# Patient Record
Sex: Female | Born: 1945 | Race: Black or African American | Hispanic: No | Marital: Married | Smoking: Never smoker
Health system: Southern US, Community
[De-identification: ages and names within clinical notes are randomized; demographics above are authoritative.]

## PROBLEM LIST (undated history)

## (undated) DIAGNOSIS — I10 Essential (primary) hypertension: Secondary | ICD-10-CM

## (undated) DIAGNOSIS — F0390 Unspecified dementia without behavioral disturbance: Secondary | ICD-10-CM

---

## 2008-06-18 ENCOUNTER — Inpatient Hospital Stay: Payer: Self-pay | Admitting: Internal Medicine

## 2010-08-31 ENCOUNTER — Emergency Department: Payer: Self-pay | Admitting: Internal Medicine

## 2010-09-09 ENCOUNTER — Ambulatory Visit: Payer: Self-pay | Admitting: Vascular Surgery

## 2015-08-16 ENCOUNTER — Encounter: Payer: Self-pay | Admitting: Emergency Medicine

## 2015-08-16 ENCOUNTER — Emergency Department
Admission: EM | Admit: 2015-08-16 | Discharge: 2015-08-16 | Disposition: A | Discharge status: Home / Self Care | Payer: Medicare Other | Attending: Emergency Medicine | Admitting: Emergency Medicine

## 2015-08-16 ENCOUNTER — Other Ambulatory Visit: Payer: Self-pay

## 2015-08-16 DIAGNOSIS — Y998 Other external cause status: Secondary | ICD-10-CM | POA: Insufficient documentation

## 2015-08-16 DIAGNOSIS — T476X1A Poisoning by antidiarrheal drugs, accidental (unintentional), initial encounter: Secondary | ICD-10-CM | POA: Diagnosis not present

## 2015-08-16 DIAGNOSIS — Y9389 Activity, other specified: Secondary | ICD-10-CM | POA: Insufficient documentation

## 2015-08-16 DIAGNOSIS — Y9289 Other specified places as the place of occurrence of the external cause: Secondary | ICD-10-CM | POA: Insufficient documentation

## 2015-08-16 DIAGNOSIS — T6591XA Toxic effect of unspecified substance, accidental (unintentional), initial encounter: Secondary | ICD-10-CM

## 2015-08-16 DIAGNOSIS — F0390 Unspecified dementia without behavioral disturbance: Secondary | ICD-10-CM | POA: Insufficient documentation

## 2015-08-16 DIAGNOSIS — I10 Essential (primary) hypertension: Secondary | ICD-10-CM | POA: Diagnosis not present

## 2015-08-16 DIAGNOSIS — T476X4A Poisoning by antidiarrheal drugs, undetermined, initial encounter: Secondary | ICD-10-CM | POA: Diagnosis present

## 2015-08-16 HISTORY — DX: Essential (primary) hypertension: I10

## 2015-08-16 LAB — CBC WITH DIFFERENTIAL/PLATELET
BASOS PCT: 1 %
Basophils Absolute: 0 10*3/uL (ref 0–0.1)
Eosinophils Absolute: 0.1 10*3/uL (ref 0–0.7)
Eosinophils Relative: 1 %
HEMATOCRIT: 30.7 % — AB (ref 35.0–47.0)
Hemoglobin: 10.3 g/dL — ABNORMAL LOW (ref 12.0–16.0)
LYMPHS ABS: 1.7 10*3/uL (ref 1.0–3.6)
LYMPHS PCT: 25 %
MCH: 28.4 pg (ref 26.0–34.0)
MCHC: 33.5 g/dL (ref 32.0–36.0)
MCV: 84.8 fL (ref 80.0–100.0)
MONO ABS: 0.6 10*3/uL (ref 0.2–0.9)
MONOS PCT: 9 %
NEUTROS ABS: 4.2 10*3/uL (ref 1.4–6.5)
Neutrophils Relative %: 64 %
Platelets: 180 10*3/uL (ref 150–440)
RBC: 3.62 MIL/uL — ABNORMAL LOW (ref 3.80–5.20)
RDW: 16.2 % — AB (ref 11.5–14.5)
WBC: 6.6 10*3/uL (ref 3.6–11.0)

## 2015-08-16 LAB — COMPREHENSIVE METABOLIC PANEL
ALT: 13 U/L — ABNORMAL LOW (ref 14–54)
ANION GAP: 10 (ref 5–15)
AST: 27 U/L (ref 15–41)
Albumin: 4 g/dL (ref 3.5–5.0)
Alkaline Phosphatase: 34 U/L — ABNORMAL LOW (ref 38–126)
BILIRUBIN TOTAL: 0.6 mg/dL (ref 0.3–1.2)
BUN: 17 mg/dL (ref 6–20)
CALCIUM: 9.2 mg/dL (ref 8.9–10.3)
CO2: 24 mmol/L (ref 22–32)
Chloride: 100 mmol/L — ABNORMAL LOW (ref 101–111)
Creatinine, Ser: 1.14 mg/dL — ABNORMAL HIGH (ref 0.44–1.00)
GFR calc Af Amer: 56 mL/min — ABNORMAL LOW (ref >60)
GFR, EST NON AFRICAN AMERICAN: 48 mL/min — AB (ref >60)
Glucose, Bld: 72 mg/dL (ref 65–99)
POTASSIUM: 3.8 mmol/L (ref 3.5–5.1)
Sodium: 134 mmol/L — ABNORMAL LOW (ref 135–145)
TOTAL PROTEIN: 7.3 g/dL (ref 6.5–8.1)

## 2015-08-16 LAB — ETHANOL

## 2015-08-16 LAB — ACETAMINOPHEN LEVEL

## 2015-08-16 LAB — SALICYLATE LEVEL: Salicylate Lvl: <4 mg/dL (ref 2.8–30.0)

## 2015-08-16 NOTE — ED Provider Notes (Signed)
CSN: [id number]     Arrival date & time 08/16/15  1441 History   First MD Initiated Contact with Patient 08/16/15 1503     Chief Complaint  Patient presents with  . Ingestion     (Consider location/radiation/quality/duration/timing/severity/associated sxs/prior Treatment) The history is provided by the patient.  Gabriella Harrell is a 69 y.o. female hx of HTN, dementia here with possible ingestion. As per husband, patient may have taken some of his lomotil. He filled his prescription 8/31 and had 30 pills. He takes up to 5 pills a day. He noticed that there are only 2 pills left in the bottle. He wasn't sure how much was in it earlier today but thought that she may have taken it. Patient denies taking any additional medicines but she does have a history of dementia. She is on venlafaxine , lisinopril , letrozole 2/5mg  but denies taking more meds. Denies vomiting, fever, ab pain, chest pain. Denies suicidal ideation or homicidal ideation.  Level V caveat- dementia    Past Medical History  Diagnosis Date  . Hypertension    No past surgical history on file. No family history on file. Social History  Substance Use Topics  . Smoking status: Never Smoker   . Smokeless tobacco: None  . Alcohol Use: No   OB History    No data available     Review of Systems  Psychiatric/Behavioral: Negative for suicidal ideas and self-injury.  All other systems reviewed and are negative.     Allergies  Review of patient's allergies indicates no known allergies.  Home Medications   Prior to Admission medications   Not on File   BP 132/64 mmHg  Pulse 74  Temp(Src) 97.6 F (36.4 C) (Oral)  Resp 20  Ht  (1.753 m)  Wt 159 lb (72.122 kg)  BMI 23.47 kg/m2  SpO2 97% Physical Exam  Constitutional: She is oriented to person, place, and time.  Demented, NAD, pleasant   HENT:  Head: Normocephalic.  Mouth/Throat: Oropharynx is clear and moist.  Eyes: Conjunctivae are normal.  Pupils are equal, round, and reactive to light.  Neck: Normal range of motion. Neck supple.  Cardiovascular: Normal rate, regular rhythm and normal heart sounds.   Pulmonary/Chest: Effort normal and breath sounds normal. No respiratory distress. She has no wheezes. She has no rales.  Abdominal: Soft. Bowel sounds are normal. She exhibits no distension. There is no tenderness. There is no rebound and no guarding.  Musculoskeletal: Normal range of motion. She exhibits no edema or tenderness.  Neurological: She is alert and oriented to person, place, and time.  Demented, nl strength and sensation throughout   Skin: Skin is warm and dry.  Psychiatric: She has a normal mood and affect. Her behavior is normal. Judgment and thought content normal.  Nursing note and vitals reviewed.   ED Course  Procedures (including critical care time) Labs Review Labs Reviewed  CBC WITH DIFFERENTIAL/PLATELET - Abnormal; Notable for the following:    RBC 3.62 (*)    Hemoglobin 10.3 (*)    HCT 30.7 (*)    RDW 16.2 (*)    All other components within normal limits  COMPREHENSIVE METABOLIC PANEL - Abnormal; Notable for the following:    Sodium 134 (*)    Chloride 100 (*)    Creatinine, Ser 1.14 (*)    ALT 13 (*)    Alkaline Phosphatase 34 (*)    GFR calc non Af Amer 48 (*)    GFR calc  Af Amer 56 (*)    All other components within normal limits  ACETAMINOPHEN LEVEL - Abnormal; Notable for the following:    Acetaminophen (Tylenol), Serum <10 (*)    All other components within normal limits  ETHANOL  SALICYLATE LEVEL  URINE DRUG SCREEN, QUALITATIVE ([facility] ONLY)    Imaging Review No results found. I have personally reviewed and evaluated these images and lab results as part of my medical decision-making.   EKG Interpretation None      ED ECG REPORT I, YAO, DAVID, the attending physician, personally viewed and interpreted this ECG.   Date: 08/16/2015  EKG Time: 15:38  Rate: 71  Rhythm: normal  EKG, normal sinus rhythm, unchanged from previous tracings  Axis: normal  Intervals:none  ST&T Change: none   MDM   Final diagnoses:  None    Gabriella Harrell is a 69 y.o. female here with possible lomotil ingestion. There are 30 pills filled a month ago and husband takes 1-2 daily so likely not much left at this point so she at most took 5-10 pills. I consulted poison control, who recommend screening labs. No signs of cholinergic symptoms. She is on other meds but I doubt significant ingestion. Will observe.   5:35 PM Labs at baseline. Tox neg. BP improved to 132/64 on recheck with no intervention. Discussed with poison control, who recommend discharge    Richardean Canal, MD 08/16/15 1746

## 2015-08-16 NOTE — Discharge Instructions (Signed)
Take your meds as prescribed and not other people's medicines.   Follow up with your doctor.   Return to ER if you are constipated, fever, vomiting, not behaving normally.

## 2015-08-16 NOTE — ED Notes (Signed)
Pt arrives to ed via [place] [facility] with reports of possibly taking an unknown amount of lamotil. Pt's husband currently being treated for cancer and has to take it for diarrhea. The medicine was filled on 8/31 with #30 count, husband supposed to be taking up to 5 pills a day. Ems reports only two pills left in bottle. Not sure how many pills husband was taking a day due to pt having dementia. Ems reports vss for patient. Other information reported to ems by a "cousin".  Pt currently taking venlafaxine , lisinopril , letrozole 2/5mg .  Pt denies any thoughts of SI or HI

## 2015-09-07 ENCOUNTER — Emergency Department
Admission: EM | Admit: 2015-09-07 | Discharge: 2015-09-09 | Disposition: A | Discharge status: Other Acute Inpatient Hospital | Payer: Medicare Other | Attending: Emergency Medicine | Admitting: Emergency Medicine

## 2015-09-07 ENCOUNTER — Encounter: Payer: Self-pay | Admitting: *Deleted

## 2015-09-07 DIAGNOSIS — I10 Essential (primary) hypertension: Secondary | ICD-10-CM | POA: Insufficient documentation

## 2015-09-07 DIAGNOSIS — F0391 Unspecified dementia with behavioral disturbance: Secondary | ICD-10-CM | POA: Diagnosis not present

## 2015-09-07 DIAGNOSIS — R451 Restlessness and agitation: Secondary | ICD-10-CM

## 2015-09-07 DIAGNOSIS — R7989 Other specified abnormal findings of blood chemistry: Secondary | ICD-10-CM

## 2015-09-07 DIAGNOSIS — R419 Unspecified symptoms and signs involving cognitive functions and awareness: Secondary | ICD-10-CM

## 2015-09-07 HISTORY — DX: Unspecified dementia, unspecified severity, without behavioral disturbance, psychotic disturbance, mood disturbance, and anxiety: F03.90

## 2015-09-07 LAB — CBC
HEMATOCRIT: 31.7 % — AB (ref 35.0–47.0)
HEMOGLOBIN: 10.7 g/dL — AB (ref 12.0–16.0)
MCH: 28.2 pg (ref 26.0–34.0)
MCHC: 33.8 g/dL (ref 32.0–36.0)
MCV: 83.6 fL (ref 80.0–100.0)
Platelets: 182 10*3/uL (ref 150–440)
RBC: 3.79 MIL/uL — AB (ref 3.80–5.20)
RDW: 16 % — ABNORMAL HIGH (ref 11.5–14.5)
WBC: 4.6 10*3/uL (ref 3.6–11.0)

## 2015-09-07 LAB — COMPREHENSIVE METABOLIC PANEL
ALBUMIN: 4 g/dL (ref 3.5–5.0)
ALK PHOS: 33 U/L — AB (ref 38–126)
ALT: 14 U/L (ref 14–54)
ANION GAP: 6 (ref 5–15)
AST: 21 U/L (ref 15–41)
BILIRUBIN TOTAL: 0.4 mg/dL (ref 0.3–1.2)
BUN: 13 mg/dL (ref 6–20)
CALCIUM: 8.8 mg/dL — AB (ref 8.9–10.3)
CO2: 28 mmol/L (ref 22–32)
Chloride: 103 mmol/L (ref 101–111)
Creatinine, Ser: 0.92 mg/dL (ref 0.44–1.00)
GFR calc non Af Amer: >60 mL/min (ref >60)
GLUCOSE: 119 mg/dL — AB (ref 65–99)
POTASSIUM: 3.6 mmol/L (ref 3.5–5.1)
SODIUM: 137 mmol/L (ref 135–145)
TOTAL PROTEIN: 7 g/dL (ref 6.5–8.1)

## 2015-09-07 LAB — URINALYSIS COMPLETE WITH MICROSCOPIC (ARMC ONLY)
BILIRUBIN URINE: NEGATIVE
Bacteria, UA: NONE SEEN
Glucose, UA: NEGATIVE mg/dL
KETONES UR: NEGATIVE mg/dL
NITRITE: NEGATIVE
PH: 7 (ref 5.0–8.0)
PROTEIN: NEGATIVE mg/dL
SPECIFIC GRAVITY, URINE: 1.006 (ref 1.005–1.030)

## 2015-09-07 LAB — ETHANOL

## 2015-09-07 LAB — TSH: TSH: 0.336 u[IU]/mL — ABNORMAL LOW (ref 0.350–4.500)

## 2015-09-07 NOTE — ED Notes (Addendum)
Patient resting quietly in room.  No noted distress or abnormal behaviors noted.  Will continue 15 minute checks and observation by security camera for safety.    ED BHU PLACEMENT JUSTIFICATION Is the patient under IVC or is there intent for IVC: No. Is the patient medically cleared: Yes.   Is there vacancy in the ED BHU: Yes.   Is the population mix appropriate for patient: No. Is the patient awaiting placement in inpatient or outpatient setting: No. Has the patient had a psychiatric consult: No. Survey of unit performed for contraband, proper placement and condition of furniture, tampering with fixtures in bathroom, shower, and each patient room: Yes.  ; Findings:  APPEARANCE/BEHAVIOR Pt asleep NEURO ASSESSMENT Orientation: Pt asleep Hallucinations: Pt asleep Speech: Pt asleep Gait:Pt asleep RESPIRATORY ASSESSMENT Normal expansion.  Clear to auscultation.  No rales, rhonchi, or wheezing. CARDIOVASCULAR ASSESSMENT regular rate and rhythm, S1, S2 normal, no murmur, click, rub or gallop GASTROINTESTINAL ASSESSMENT soft, nontender, BS WNL, no r/g EXTREMITIES Pt asleep PLAN OF CARE Provide calm/safe environment. Vital signs assessed twice daily. ED BHU Assessment once each 12-hour shift. Collaborate with intake RN daily or as condition indicates. Assure the ED provider has rounded once each shift. Provide and encourage hygiene. Provide redirection as needed. Assess for escalating behavior; address immediately and inform ED provider.  Assess family dynamic and appropriateness for visitation as needed: No.; If necessary, describe findings: Pt asleep Educate the patient/family about BHU procedures/visitation: No.; If necessary, describe findings: Pt asleep  ENVIRONMENTAL ASSESSMENT Potentially harmful objects out of patient reach: Yes.   Personal belongings secured: Yes.   Patient dressed in hospital provided attire only: Yes.   Plastic bags out of patient reach: Yes.   Patient care  equipment (cords, cables, call bells, lines, and drains) shortened, removed, or accounted for: Yes.   Equipment and supplies removed from bottom of stretcher: Yes.   Potentially toxic materials out of patient reach: Yes.   Sharps container removed or out of patient reach: Yes.

## 2015-09-07 NOTE — ED Notes (Signed)
BEHAVIORAL HEALTH ROUNDING Patient sleeping: Yes.   Patient alert and oriented: not applicable Behavior appropriate: Yes.   Nutrition and fluids offered: Yes  Toileting and hygiene offered: Yes  Sitter present: yes Law enforcement present: Yes  
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
## 2015-09-07 NOTE — ED Notes (Signed)
BEHAVIORAL HEALTH ROUNDING Patient sleeping: No. Patient alert and oriented: no Behavior appropriate: Yes.  ; If no, describe: dementia Nutrition and fluids offered: Yes  Toileting and hygiene offered: Yes  Sitter present: yes Law enforcement present: Yes

## 2015-09-07 NOTE — ED Notes (Addendum)
Gabriella GrimmerDianh Harrell [phone]  [phone] sister-in-law Gabriella CornfieldWilliam Harrell- brother [phone]  Clothes and belongings sent home with family

## 2015-09-07 NOTE — ED Notes (Signed)
ENVIRONMENTAL ASSESSMENT Potentially harmful objects out of patient reach: Yes.   Personal belongings secured: No. Patient dressed in hospital provided attire only: Yes.   Plastic bags out of patient reach: Yes.   Patient care equipment (cords, cables, call bells, lines, and drains) shortened, removed, or accounted for: Yes.   Equipment and supplies removed from bottom of stretcher: Yes.   Potentially toxic materials out of patient reach: Yes.   Sharps container removed or out of patient reach: Yes.   

## 2015-09-07 NOTE — ED Notes (Signed)
Cell number for brother, Chrissie NoaWilliam: [phone]
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
## 2015-09-07 NOTE — BH Assessment (Addendum)
Assessment Note  During the assessment the patient only starred at the wall and did not answer a lot of the questions.  Patient is a poor historian.    Writer was only able to obtain limited collateral information from the patient's brother Judeth Cornfield [phone]).  Per Chrissie Noa, the patient has, "has not been herself for several years".  Per Chrissie Noa, the patient has never been suicidal, homicidal, psychotic or problems with substance abuse.  Chrissie Noa reports that he is not sure if the patient has been diagnosed with Dementia.  He did report that the patient does not take any type of medication that he is aware of.   Per Chrissie Noa, the patient has been forgetful for several years and requires constant supervision.  The patient resides at home with her husband.  The patient has been married for over 30 years.  Per Chrissie Noa he only has limited information about his sister.  Her brother reports that her husband will have more information regarding his sister.     Husband is Lolly Glaus [phone]) attempted to obtain collateral information without success.  Writer was not able to leave a voice mail message because the voice mail system was not activated.    Diagnosis: Mood Disorder   Past Medical History:  Past Medical History  Diagnosis Date  . Hypertension   . Dementia     History reviewed. No pertinent past surgical history.  Family History: History reviewed. No pertinent family history.  Social History:  reports that she has never smoked. She does not have any smokeless tobacco history on file. She reports that she does not drink alcohol. Her drug history is not on file.  Additional Social History:  Alcohol / Drug Use History of alcohol / drug use?: No history of alcohol / drug abuse  CIWA: CIWA-Ar BP: (!) 151/82 mmHg Pulse Rate: 85 COWS:    Allergies: No Known Allergies  Home Medications:  (Not in a hospital admission)  OB/GYN Status:  No LMP recorded. Patient  has had a hysterectomy.  General Assessment Data Location of Assessment: [facility] ED TTS Assessment: In system Is this a Tele or Face-to-Face Assessment?: Face-to-Face Is this an Initial Assessment or a Re-assessment for this encounter?: Initial Assessment Marital status:  (Unable to assess) Maiden name: NA Is patient pregnant?: No Pregnancy Status: No Living Arrangements: Other relatives Can pt return to current living arrangement?: Yes Admission Status: Voluntary Is patient capable of signing voluntary admission?: Yes Referral Source: Self/Family/Friend Insurance type: Medicare  Medical Screening Exam [facility] Walk-in ONLY) Medical Exam completed: Yes  Crisis Care Plan Living Arrangements: Other relatives Name of Psychiatrist: Unable to assess Name of Therapist: Unable to assess  Education Status Is patient currently in school?: No Current Grade: NA Highest grade of school patient has completed: NA Name of school: NA Contact person: NA  Risk to self with the past 6 months Suicidal Ideation: No Has patient been a risk to self within the past 6 months prior to admission? : No Suicidal Intent: No Has patient had any suicidal intent within the past 6 months prior to admission? : No Is patient at risk for suicide?: No Suicidal Plan?: No Has patient had any suicidal plan within the past 6 months prior to admission? : No Access to Means: No What has been your use of drugs/alcohol within the last 12 months?: NA Previous Attempts/Gestures: No How many times?: 0 Other Self Harm Risks: NA Triggers for Past Attempts: None known Intentional Self Injurious Behavior: None Family  Suicide History: No Recent stressful life event(s):  (NA) Persecutory voices/beliefs?: No Depression: No Depression Symptoms: Despondent Substance abuse history and/or treatment for substance abuse?: No Suicide prevention information given to non-admitted patients: Not applicable  Risk to Others within the  past 6 months Homicidal Ideation: No Does patient have any lifetime risk of violence toward others beyond the six months prior to admission? : No Thoughts of Harm to Others: No Current Homicidal Intent: No Current Homicidal Plan: No Access to Homicidal Means: No Identified Victim: NA History of harm to others?: No Assessment of Violence: None Noted Violent Behavior Description: NA Does patient have access to weapons?: No Criminal Charges Pending?: No Does patient have a court date: No Is patient on probation?: No  Psychosis Hallucinations: None noted Delusions: None noted  Mental Status Report Appearance/Hygiene: Disheveled Eye Contact: Poor Motor Activity: Unable to assess Speech: Unable to assess Level of Consciousness: Unable to assess Mood: Depressed Affect: Unable to Assess Anxiety Level: None Thought Processes: Unable to Assess Judgement: Unable to Assess Orientation: Not oriented Obsessive Compulsive Thoughts/Behaviors: Unable to Assess  Cognitive Functioning Concentration: Unable to Assess Memory: Unable to Assess IQ: Average Insight: Unable to Assess Impulse Control: Unable to Assess Appetite:  (UTA) Weight Loss:  (UTA) Weight Gain:  (UTA) Sleep: Unable to Assess Total Hours of Sleep:  (UTA) Vegetative Symptoms: Unable to Assess  ADLScreening [facility] Assessment Services) Patient's cognitive ability adequate to safely complete daily activities?: No Patient able to express need for assistance with ADLs?: No Independently performs ADLs?: No  Prior Inpatient Therapy Prior Inpatient Therapy: No Prior Therapy Dates: UTA Prior Therapy Facilty/Provider(s): UTA Reason for Treatment: UTA  Prior Outpatient Therapy Prior Outpatient Therapy: No Prior Therapy Dates: UTA Prior Therapy Facilty/Provider(s): UTA Reason for Treatment: UTA Does patient have an ACCT team?: No Does patient have Intensive In-House Services?  : No Does patient have [facility] services? :  No Does patient have P4CC services?: No  ADL Screening (condition at time of admission) Patient's cognitive ability adequate to safely complete daily activities?: No Is the patient deaf or have difficulty hearing?: No Does the patient have difficulty seeing, even when wearing glasses/contacts?: No Does the patient have difficulty concentrating, remembering, or making decisions?: Yes Patient able to express need for assistance with ADLs?: No Does the patient have difficulty dressing or bathing?: Yes Independently performs ADLs?: No Does the patient have difficulty walking or climbing stairs?: Yes Weakness of Legs:  (Unable to assess) Weakness of Arms/Hands:  (Unable to assess)  Home Assistive Devices/Equipment Home Assistive Devices/Equipment:  (Unable to assess)    Abuse/Neglect Assessment (Assessment to be complete while patient is alone) Physical Abuse:  (Unable to assess) Verbal Abuse:  (Unable to assess) Sexual Abuse:  (Unable to assess) Exploitation of patient/patient's resources:  (Unable to assess) Self-Neglect:  (Unable to assess) Values / Beliefs Cultural Requests During Hospitalization: None Spiritual Requests During Hospitalization: None Consults Spiritual Care Consult Needed: No Social Work Consult Needed: No Merchant navy officer (For Healthcare) Does patient have an advance directive?: No Would patient like information on creating an advanced directive?: No - patient declined information    Additional Information 1:1 In Past 12 Months?: No CIRT Risk: No Elopement Risk: No Does patient have medical clearance?: Yes     Disposition: Pending psych disposition.  Disposition Initial Assessment Completed for this Encounter: Yes Disposition of Patient: Other dispositions (Pending psych disposition.) Other disposition(s):  (Pending psych disposition .)  On Site Evaluation by:   Reviewed with Physician:  Phillip HealStevenson, Fayrene Towner [place] 09/07/2015 9:10 PM

## 2015-09-08 ENCOUNTER — Emergency Department: Payer: Medicare Other

## 2015-09-08 DIAGNOSIS — R7989 Other specified abnormal findings of blood chemistry: Secondary | ICD-10-CM

## 2015-09-08 DIAGNOSIS — R419 Unspecified symptoms and signs involving cognitive functions and awareness: Secondary | ICD-10-CM

## 2015-09-08 DIAGNOSIS — F99 Mental disorder, not otherwise specified: Secondary | ICD-10-CM

## 2015-09-08 LAB — T4, FREE: FREE T4: 0.81 ng/dL (ref 0.61–1.12)

## 2015-09-08 NOTE — ED Notes (Signed)
Patient resting quietly in room. No noted distress or abnormal behaviors noted. Will continue 15 minute checks. 

## 2015-09-08 NOTE — ED Notes (Signed)
BEHAVIORAL HEALTH ROUNDING Patient sleeping: No. Patient alert and oriented: yes Behavior appropriate: Yes.  ; If no, describe:  Nutrition and fluids offered: Yes  Toileting and hygiene offered: Yes  Sitter present: no Law enforcement present: Yes  

## 2015-09-08 NOTE — ED Notes (Signed)
BEHAVIORAL HEALTH ROUNDING Patient sleeping: Yes.   Patient alert and oriented: not applicable Behavior appropriate: Yes.  ; If no, describe:  Nutrition and fluids offered: No Toileting and hygiene offered: No Sitter present: q15min rounding Law enforcement present: Yes   

## 2015-09-08 NOTE — ED Notes (Signed)
BEHAVIORAL HEALTH ROUNDING Patient sleeping: Yes.   Patient alert and oriented: not applicable Behavior appropriate: Yes.  ; If no, describe:  Nutrition and fluids offered: Yes  Toileting and hygiene offered: Yes  Sitter present: yes Law enforcement present: Yes  

## 2015-09-08 NOTE — ED Notes (Signed)
Pt is ambulatory in room   Wrapped in blanket. Denies any complaints  Skin w/d and resp even and non labored
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
## 2015-09-08 NOTE — ED Notes (Signed)
BEHAVIORAL HEALTH ROUNDING Patient sleeping: Yes.   Patient alert and oriented: not applicable Behavior appropriate: Yes.  ; If no, describe:  Nutrition and fluids offered: No Toileting and hygiene offered: No Sitter present: yes Law enforcement present: Yes   

## 2015-09-08 NOTE — ED Notes (Addendum)
BEHAVIORAL HEALTH ROUNDING Patient sleeping: No. Patient alert and oriented: yes Behavior appropriate: Yes.  ; If no, describe:  Nutrition and fluids offered: Yes  Toileting and hygiene offered: Yes  Sitter present: yes Law enforcement present: Yes  

## 2015-09-08 NOTE — ED Notes (Signed)
Patient resting quietly in room.  No noted distress or abnormal behaviors noted.  Will continue 15 minute checks and observation.  

## 2015-09-08 NOTE — ED Notes (Signed)
Patient still has a ring in place unable to remove ring  At this time

## 2015-09-08 NOTE — ED Notes (Signed)
BEHAVIORAL HEALTH ROUNDING Patient sleeping: No. Patient alert and oriented: a&ox1 Behavior appropriate: Yes.  ; If no, describe: cooperative, but paces incessently Nutrition and fluids offered: Yes  Toileting and hygiene offered: Yes  Sitter present: yes Law enforcement present: Yes

## 2015-09-08 NOTE — ED Notes (Signed)
BEHAVIORAL HEALTH ROUNDING Patient sleeping: Yes.   Patient alert and oriented: no Behavior appropriate: Yes.  ; If no, describe:  Nutrition and fluids offered: sleeping Toileting and hygiene offered: sleeping Sitter present: no Law enforcement present: Yes
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
## 2015-09-08 NOTE — ED Notes (Signed)
BEHAVIORAL HEALTH ROUNDING Patient sleeping: No. Patient alert and oriented: yes Behavior appropriate: Yes.  ; If no, describe:  Nutrition and fluids offered: Yes  Toileting and hygiene offered: Yes  Sitter present: yes Law enforcement present: Yes  

## 2015-09-08 NOTE — ED Notes (Signed)
Gabriella Harrell is Voluntary is waiting for placement

## 2015-09-08 NOTE — ED Notes (Signed)

## 2015-09-08 NOTE — ED Provider Notes (Signed)
-----------------------------------------   6:47 AM on 09/08/2015 -----------------------------------------   Blood pressure 151/82, pulse 85, temperature 98 F (36.7 C), temperature source Oral, resp. rate 16, height 5\' 9"  (1.753 m), weight 160 lb (72.576 kg), SpO2 100 %.  The patient had no acute events since last update.  Calm and cooperative at this time.  Disposition is pending per Psychiatry/Behavioral Medicine team recommendations.     Irean HongJade J Vaden Becherer, MD 09/08/15 [phone]

## 2015-09-09 LAB — FOLATE: Folate: 9.9 ng/mL (ref >5.9)

## 2015-09-09 MED ORDER — TRAZODONE HCL 50 MG PO TABS: 25.0000 mg | ORAL_TABLET | Freq: Every day | ORAL | Status: DC

## 2015-09-09 MED ORDER — MEMANTINE HCL 5 MG PO TABS
5.0000 mg | ORAL_TABLET | Freq: Every day | ORAL | Status: DC
  Administered 2015-09-09: 5 mg via ORAL
  Filled 2015-09-09: qty 1

## 2015-09-09 NOTE — ED Notes (Signed)
BEHAVIORAL HEALTH ROUNDING Patient sleeping: Yes.   Patient alert and oriented: no Behavior appropriate: Yes.  ; If no, describe:  Nutrition and fluids offered: sleeping Toileting and hygiene offered: No Sitter present: no Law enforcement present: Yes

## 2015-09-09 NOTE — ED Notes (Signed)

## 2015-09-09 NOTE — ED Notes (Signed)
BEHAVIORAL HEALTH ROUNDING Patient sleeping: No. Patient alert and oriented: yes Behavior appropriate: Yes.  ; If no, describe:  Nutrition and fluids offered: yes Toileting and hygiene offered: Yes  Sitter present: no Law enforcement present: Yes  

## 2015-09-09 NOTE — ED Notes (Signed)
BEHAVIORAL HEALTH ROUNDING Patient sleeping: Yes.   Patient alert and oriented: sleeping Behavior appropriate: Yes.  ; If no, describe:  Nutrition and fluids offered: sleeping Toileting and hygiene offered: sleeping Sitter present: no Law enforcement present: Yes  

## 2015-09-09 NOTE — ED Notes (Signed)
BEHAVIORAL HEALTH ROUNDING Patient sleeping: No. Patient alert and oriented: yes Behavior appropriate: Yes.  ; If no, describe:  Nutrition and fluids offered: Yes  Toileting and hygiene offered: Yes  Sitter present: no Law enforcement present: Yes  

## 2015-09-09 NOTE — ED Provider Notes (Signed)
-----------------------------------------   3:25 PM on 09/09/2015 -----------------------------------------   Blood pressure 106/70, pulse 80, temperature 98.4 F (36.9 C), temperature source Oral, resp. rate 18, height 5\' 9"  (1.753 m), weight 160 lb (72.576 kg), SpO2 99 %.  The patient had no acute events since last update.  Calm and cooperative at this time.  Patient has been accepted to [facility].  Vital stable this time. The patient is calm and cooperative. I did review her labs which had 1+ leukocyte esterase in the urine. The patient is not having any symptoms at this time. She denies any burning with urination, frequency or abdominal pain.      Myrna Blazeravid Matthew Karmel Patricelli, MD 09/09/15 [phone]

## 2015-09-09 NOTE — ED Notes (Signed)
BEHAVIORAL HEALTH ROUNDING Patient sleeping: Yes.   Patient alert and oriented: oriented to self Behavior appropriate: Yes.  ; If no, describe:  Nutrition and fluids offered: sleeping Toileting and hygiene offered: sleeping Sitter present: no Law enforcement present: Yes

## 2015-09-09 NOTE — [facility] Counselor (Signed)
[name] of [name] called stating they have a bed for referred patient Gabriella Harrell, Intake phone # [phone]. Dr. Satira AnisL. Akers is the accepting MD. Report should be called to [phone].

## 2015-09-09 NOTE — ED Notes (Signed)
Pt sleeping. 

## 2015-09-09 NOTE — Consult Note (Signed)
[facility] Consult   Reason for Consult:  Agitation   Patient Identification: Gabriella Harrell MRN:  [id number] Principal Diagnosis: Dementia Diagnosis:   Patient Active Problem List   Diagnosis Date Noted  . Neurocognitive disorder, unspecified [F99] 09/08/2015  . Abnormal TSH [R79.89] 09/08/2015    Total Time spent with patient: 30 minutes  Subjective:   Gabriella Harrell is a 69 y.o. female patient admitted with increased confusion and agitation. Per ED physician notes, the pt got out of the house 3 times today and has been difficult to restrain. She was fighting with family so she was brought to the ED by the [facility] department.   During my interview patient was calm and quiet and responded politely.   Nursing notes were reviewed. No acute events. Reasons for admission were reviewed.   She was able to tell me that she is currently in the hospital and had to be redirected.   The pt is oriented to self. She believes she is currently in [place] and shared that today is a Sunday in October.  The pt denies feeling hopeless, helpless and worthless. She denies there being weapons at home. She notes her husband is trying to put her puppies "down" but the pt shared she wants to save her puppies and put her husband down.   Patient is currently not taking any medications for depression or anxiety.     Risk to Self: Suicidal Ideation: No Suicidal Intent: No Is patient at risk for suicide?: No Suicidal Plan?: No Access to Means: No What has been your use of drugs/alcohol within the last 12 months?: NA How many times?: 0 Other Self Harm Risks: NA Triggers for Past Attempts: None known Intentional Self Injurious Behavior: None Risk to Others: Homicidal Ideation: No Thoughts of Harm to Others: No Current Homicidal Intent: No Current Homicidal Plan: No Access to Homicidal Means: No Identified Victim: NA History of harm to others?: No Assessment of  Violence: None Noted Violent Behavior Description: NA Does patient have access to weapons?: No Criminal Charges Pending?: No Does patient have a court date: No Prior Inpatient Therapy: Prior Inpatient Therapy: No Prior Therapy Dates: UTA Prior Therapy Facilty/Provider(s): UTA Reason for Treatment: UTA Prior Outpatient Therapy: Prior Outpatient Therapy: No Prior Therapy Dates: UTA Prior Therapy Facilty/Provider(s): UTA Reason for Treatment: UTA Does patient have an ACCT team?: No Does patient have Intensive In-House Services?  : No Does patient have [facility] services? : No Does patient have P4CC services?: No  Past Medical History:  Past Medical History  Diagnosis Date  . Hypertension   . Dementia    History reviewed. No pertinent past surgical history. Family History: History reviewed. No pertinent family history.  Social History:  History  Alcohol Use No     History  Drug Use Not on file    Social History   Social History  . Marital Status: Married    Spouse Name: N/A  . Number of Children: N/A  . Years of Education: N/A   Social History Main Topics  . Smoking status: Never Smoker   . Smokeless tobacco: None  . Alcohol Use: No  . Drug Use: None  . Sexual Activity: Not Asked   Other Topics Concern  . None   Social History Narrative   Additional Social History:    History of alcohol / drug use?: No history of alcohol / drug abuse  Allergies:  No Known Allergies  Labs:  Results for orders placed or performed during the hospital encounter of 09/07/15 (from the past 48 hour(s))  Comprehensive metabolic panel     Status: Abnormal   Collection Time: 09/07/15  4:14 PM  Result Value Ref Range   Sodium 137 135 - 145 mmol/L   Potassium 3.6 3.5 - 5.1 mmol/L   Chloride 103 101 - 111 mmol/L   CO2 28 22 - 32 mmol/L   Glucose, Bld 119 (H) 65 - 99 mg/dL   BUN 13 6 - 20 mg/dL   Creatinine, Ser 0.92 0.44 - 1.00 mg/dL   Calcium 8.8 (L)  8.9 - 10.3 mg/dL   Total Protein 7.0 6.5 - 8.1 g/dL   Albumin 4.0 3.5 - 5.0 g/dL   AST 21 15 - 41 U/L   ALT 14 14 - 54 U/L   Alkaline Phosphatase 33 (L) 38 - 126 U/L   Total Bilirubin 0.4 0.3 - 1.2 mg/dL   GFR calc non Af Amer >60 >60 mL/min   GFR calc Af Amer >60 >60 mL/min    Comment: (NOTE) The eGFR has been calculated using the CKD EPI equation. This calculation has not been validated in all clinical situations. eGFR's persistently <60 mL/min signify possible Chronic Kidney Disease.    Anion gap 6 5 - 15  CBC     Status: Abnormal   Collection Time: 09/07/15  4:14 PM  Result Value Ref Range   WBC 4.6 3.6 - 11.0 K/uL   RBC 3.79 (L) 3.80 - 5.20 MIL/uL   Hemoglobin 10.7 (L) 12.0 - 16.0 g/dL   HCT 31.7 (L) 35.0 - 47.0 %   MCV 83.6 80.0 - 100.0 fL   MCH 28.2 26.0 - 34.0 pg   MCHC 33.8 32.0 - 36.0 g/dL   RDW 16.0 (H) 11.5 - 14.5 %   Platelets 182 150 - 440 K/uL  Ethanol     Status: None   Collection Time: 09/07/15  4:14 PM  Result Value Ref Range   Alcohol, Ethyl (B) <5 <5 mg/dL    Comment:        LOWEST DETECTABLE LIMIT FOR SERUM ALCOHOL IS 5 mg/dL FOR MEDICAL PURPOSES ONLY   TSH     Status: Abnormal   Collection Time: 09/07/15  4:14 PM  Result Value Ref Range   TSH 0.336 (L) 0.350 - 4.500 uIU/mL  T4, free     Status: None   Collection Time: 09/07/15  4:14 PM  Result Value Ref Range   Free T4 0.81 0.61 - 1.12 ng/dL  Urinalysis complete, with microscopic ([facility] only)     Status: Abnormal   Collection Time: 09/07/15  4:37 PM  Result Value Ref Range   Color, Urine YELLOW (A) YELLOW   APPearance CLEAR (A) CLEAR   Glucose, UA NEGATIVE NEGATIVE mg/dL   Bilirubin Urine NEGATIVE NEGATIVE   Ketones, ur NEGATIVE NEGATIVE mg/dL   Specific Gravity, Urine 1.006 1.005 - 1.030   Hgb urine dipstick 1+ (A) NEGATIVE   pH 7.0 5.0 - 8.0   Protein, ur NEGATIVE NEGATIVE mg/dL   Nitrite NEGATIVE NEGATIVE   Leukocytes, UA 1+ (A) NEGATIVE   RBC / HPF 0-5 0 - 5 RBC/hpf   WBC, UA 0-5 0  - 5 WBC/hpf   Bacteria, UA NONE SEEN NONE SEEN   Squamous Epithelial / LPF 0-5 (A) NONE SEEN   Mucous PRESENT     Current Facility-Administered Medications  Medication Dose Route Frequency Provider  Last Rate Last Dose  . memantine (NAMENDA) tablet 5 mg  5 mg Oral Daily Rainey Pines, MD      . traZODone (DESYREL) tablet 25 mg  25 mg Oral QHS Rainey Pines, MD       Current Outpatient Prescriptions  Medication Sig Dispense Refill  . letrozole (FEMARA) 2.5 MG tablet Take 2.5 mg by mouth daily.    Marland Kitchen lisinopril (PRINIVIL,ZESTRIL) 20 MG tablet Take 20 mg by mouth daily.    Marland Kitchen venlafaxine (EFFEXOR) 75 MG tablet Take 75 mg by mouth daily.        Psychiatric Specialty Exam: Review of Systems  Constitutional: Negative.   HENT: Negative.   Eyes: Negative.   Respiratory: Negative.   Cardiovascular: Negative.   Gastrointestinal: Negative.   Genitourinary: Negative.   Musculoskeletal: Negative.   Skin: Negative.   Neurological: Negative.   Endo/Heme/Allergies: Negative.   Psychiatric/Behavioral: Negative.     Blood pressure 106/70, pulse 80, temperature 98.4 F (36.9 C), temperature source Oral, resp. rate 18, height _0  (1.753 m), weight 160 lb (72.576 kg), SpO2 99 %.Body mass index is 23.62 kg/(m^2).  General Appearance: Disheveled and Guarded  Eye Contact::  Minimal  Speech:  Slow  Volume:  Decreased  Mood:  Depressed  Affect:  Blunt  Thought Process:  Irrelevant  Orientation:  Other:  awake  Thought Content:  Delusions and Rumination  Suicidal Thoughts:  No  Homicidal Thoughts:  No  Memory:  impaired  Judgement:  Impaired  Insight:  Lacking  Psychomotor Activity:  Psychomotor Retardation  Concentration:  Poor  Recall:  Poor  Fund of Knowledge:Poor  Language: Poor  Akathisia:  No  Handed:  Right  AIMS (if indicated):     Assets:  Physical Health Social Support  ADL's:  Impaired  Cognition: Impaired,  Moderate  Sleep:   4   Treatment Plan Summary: Daily contact  with patient to assess and evaluate symptoms and progress in treatment and Medication management  Disposition: Recommend psychiatric Inpatient admission when medically cleared.   I will order the labs including RPR J-03 and Folic  Acid.  I will also start her on Namenda 5 mg by mouth daily to help with her memory issues at this time. Patient will also be started on trazodone 25 mg by mouth daily at bedtime.  We will obtain collateral information from the family members.   Lucianne Smestad 09/09/2015 11:13 AM

## 2015-09-09 NOTE — BHH Counselor (Signed)
Contacted Ms. Gabriella Harrell's husband Gabriella Harrell at [phone]. Informed him that she was [facility] [place] for [place] psychiatric treatment. He had no questions other than to ask where the hospital was located.

## 2015-09-09 NOTE — ED Provider Notes (Signed)
-----------------------------------------   7:31 AM on 09/09/2015 -----------------------------------------   Blood pressure 139/95, pulse 86, temperature 98.4 F (36.9 C), temperature source Oral, resp. rate 16, height 5\' 9"  (1.753 m), weight 160 lb (72.576 kg), SpO2 100 %.  The patient had no acute events since last update.  Calm and cooperative at this time.  Disposition is pending per Psychiatry/Behavioral Medicine team recommendations.     Irean HongJade J Sung, MD 09/09/15 [phone]

## 2015-09-09 NOTE — BH Specialist Note (Signed)
Referral information faxed to [facility], [facility], [facility], [place], [place], and [place].

## 2015-09-09 NOTE — ED Notes (Signed)
BEHAVIORAL HEALTH ROUNDING Patient sleeping: Yes.   Patient alert and oriented: alert to self Behavior appropriate: Yes.  ; If no, describe:  Nutrition and fluids offered: No Toileting and hygiene offered: sleeping Sitter present: no Law enforcement present: Yes

## 2015-09-09 NOTE — ED Notes (Signed)
ED BHU PLACEMENT JUSTIFICATION Is the patient under IVC or is there intent for IVC: Yes.   Is the patient medically cleared: Yes.   Is there vacancy in the ED BHU: no Is the population mix appropriate for patient: Yes.   Is the patient awaiting placement in inpatient or outpatient setting: Yes.   Has the patient had a psychiatric consult: Yes.   Survey of unit performed for contraband, proper placement and condition of furniture, tampering with fixtures in bathroom, shower, and each patient room: Yes.  ; Findings:  APPEARANCE/BEHAVIOR sleeping NEURO ASSESSMENT Orientation: sleeping Hallucinations: No.None noted (Hallucinations) Speech: Normal Gait: normal RESPIRATORY ASSESSMENT Normal expansion.  Clear to auscultation.  No rales, rhonchi, or wheezing. CARDIOVASCULAR ASSESSMENT regular rate and rhythm, S1, S2 normal, no murmur, click, rub or gallop GASTROINTESTINAL ASSESSMENT soft, nontender, BS WNL, no r/g EXTREMITIES normal strength, tone, and muscle mass PLAN OF CARE Provide calm/safe environment. Vital signs assessed twice daily. ED BHU Assessment once each 12-hour shift. Collaborate with intake RN daily or as condition indicates. Assure the ED provider has rounded once each shift. Provide and encourage hygiene. Provide redirection as needed. Assess for escalating behavior; address immediately and inform ED provider.  Assess family dynamic and appropriateness for visitation as needed: Yes.  ; If necessary, describe findings:  Educate the patient/family about BHU procedures/visitation: Yes.  ; If necessary, describe findings:

## 2015-09-12 LAB — METHYLMALONIC ACID, SERUM: Methylmalonic Acid, Quantitative: 119 nmol/L (ref 0–378)

## 2015-09-12 LAB — RPR: RPR Ser Ql: NONREACTIVE

## 2017-03-26 IMAGING — CT CT HEAD W/O CM
1 series · 16 of 30 positions shown, 20 images · non-contrast
Comparison: None.

CLINICAL DATA: History of dementia with increased confusion and
agitation. No known injury. Initial encounter.

EXAM:
CT HEAD WITHOUT CONTRAST
TECHNIQUE: Contiguous axial images were obtained from the base of the skull
through the vertex without intravenous contrast.

[Series 2: head wo · axial · 0.41mm/px · z∈[+422,+548]mm · 16 of 32 slices shown, 20 images]
[im 2/32  brain]
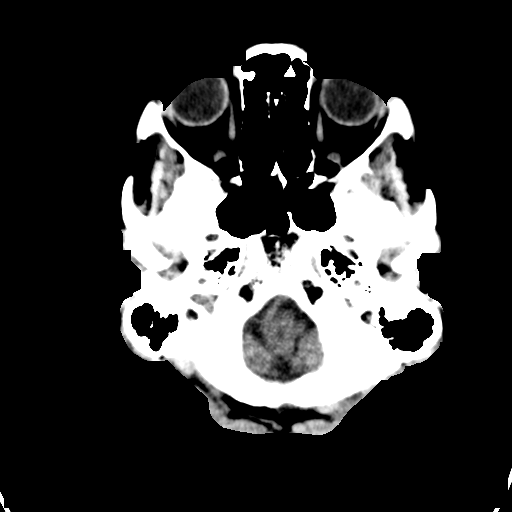
[im 2/32  bone]
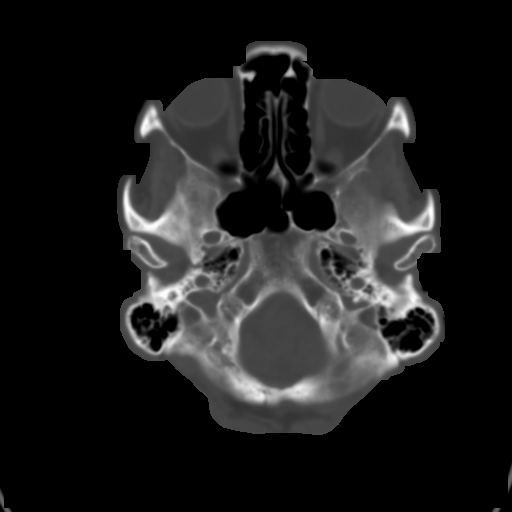
[im 4/32  brain]
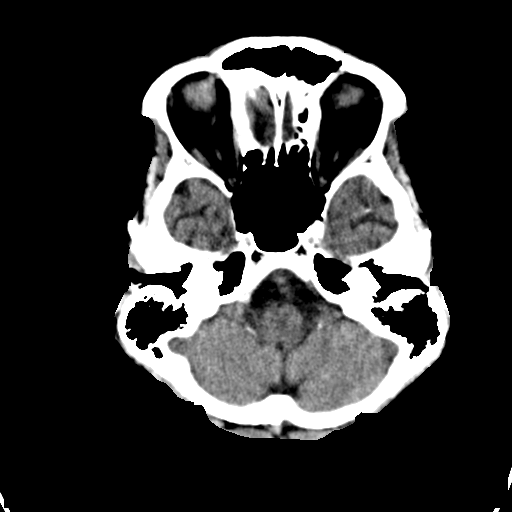
[im 6/32  brain]
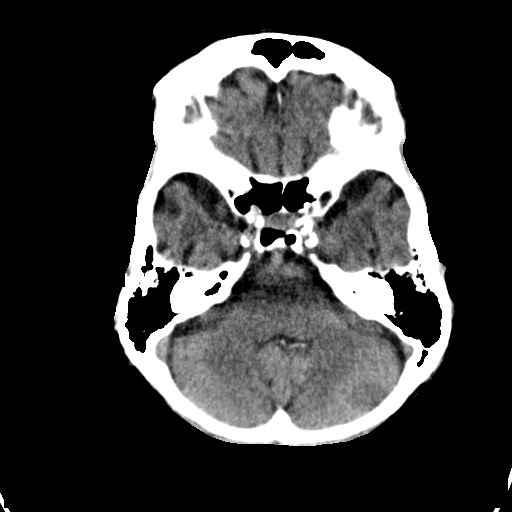
[im 8/32  brain]
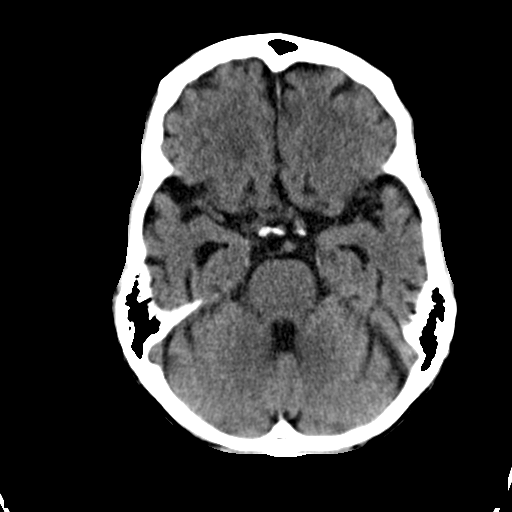
[im 9/32  brain]
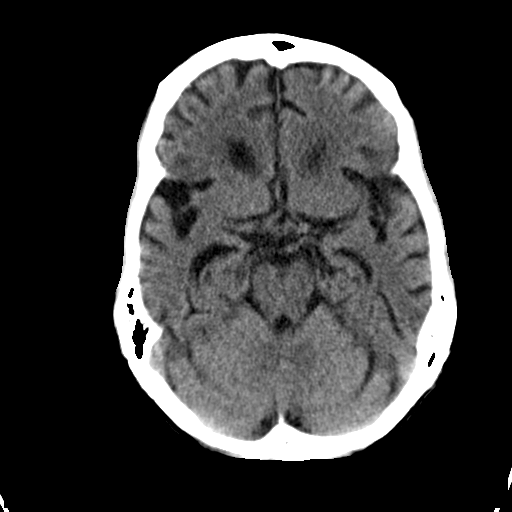
[im 9/32  bone]
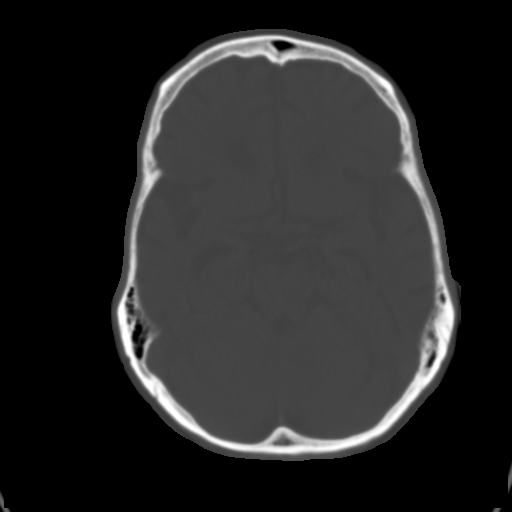
[im 11/32  brain]
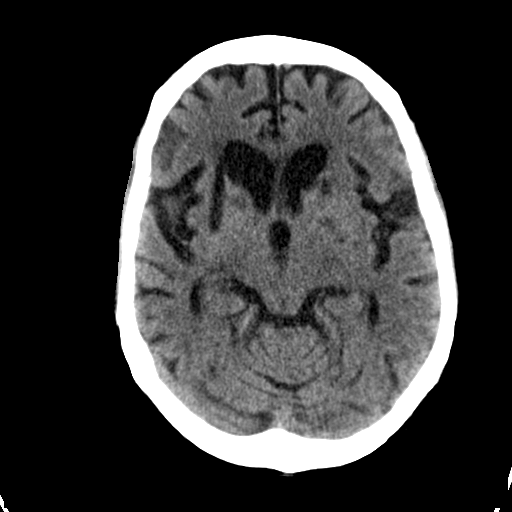
[im 13/32  brain]
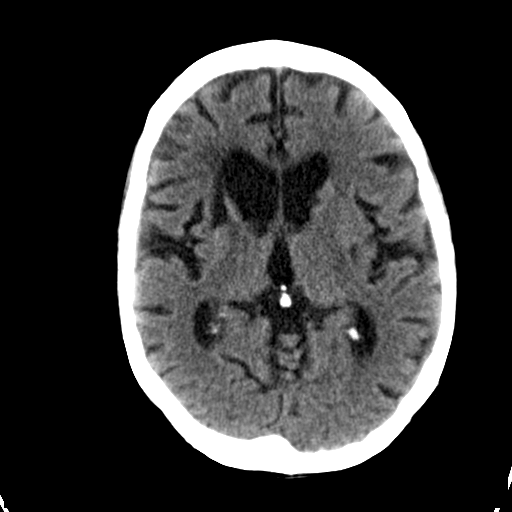
[im 15/32  brain]
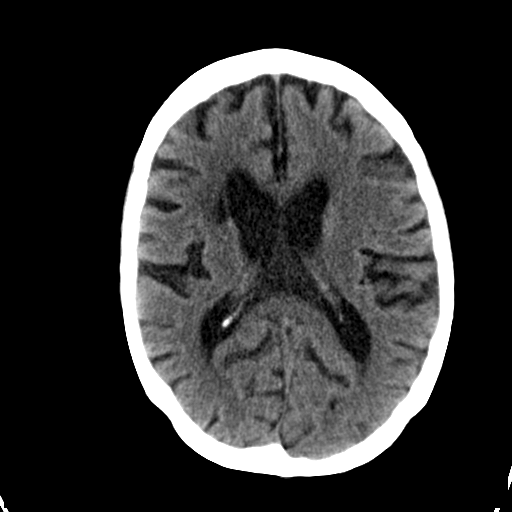
[im 17/32  brain]
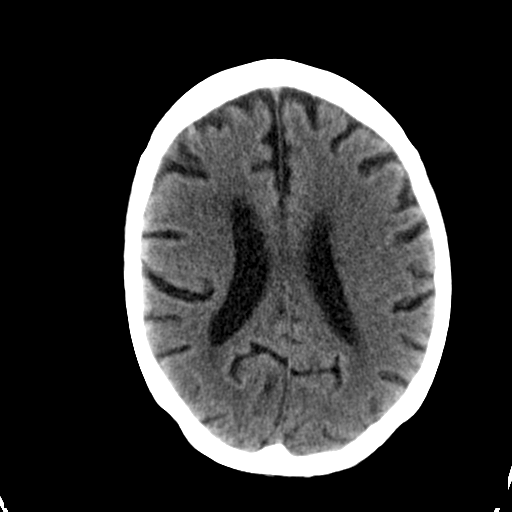
[im 17/32  bone]
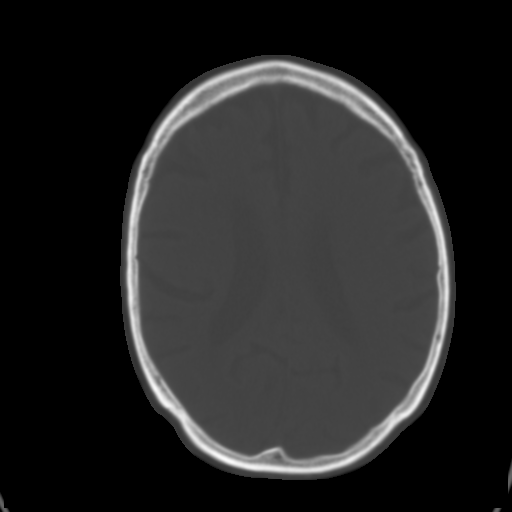
[im 19/32  brain]
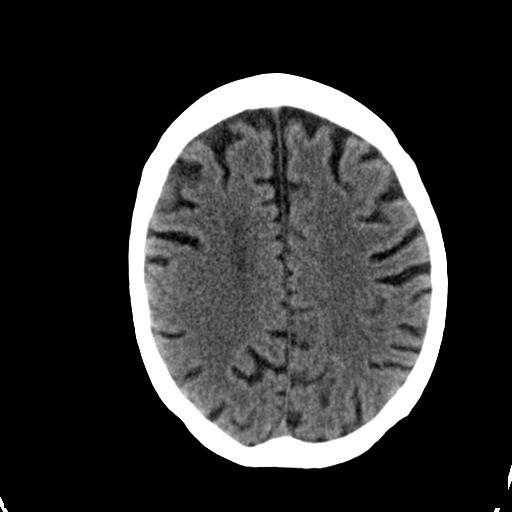
[im 21/32  brain]
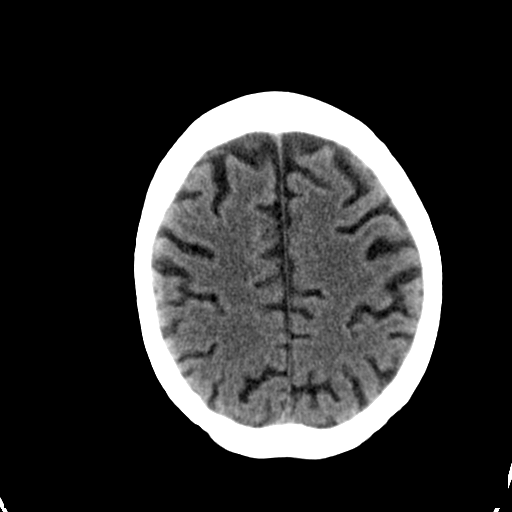
[im 23/32  brain]
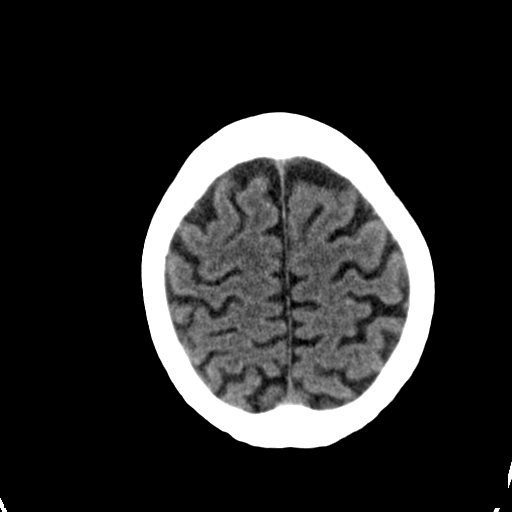
[im 24/32  brain]
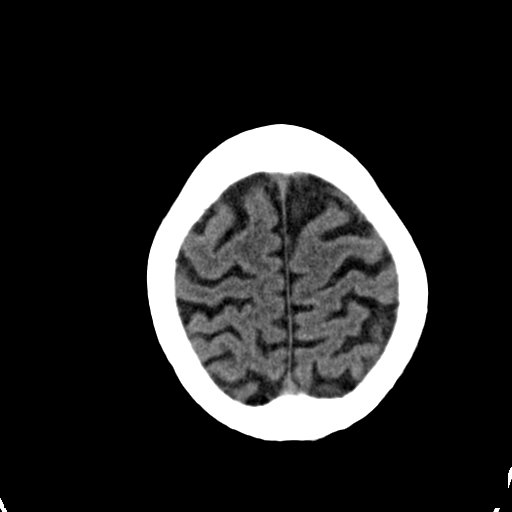
[im 24/32  bone]
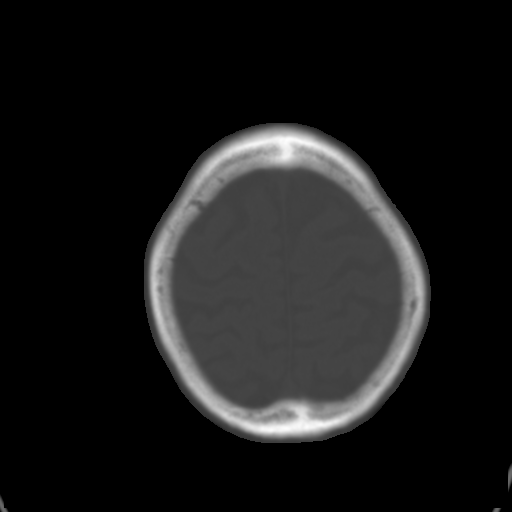
[im 26/32  brain]
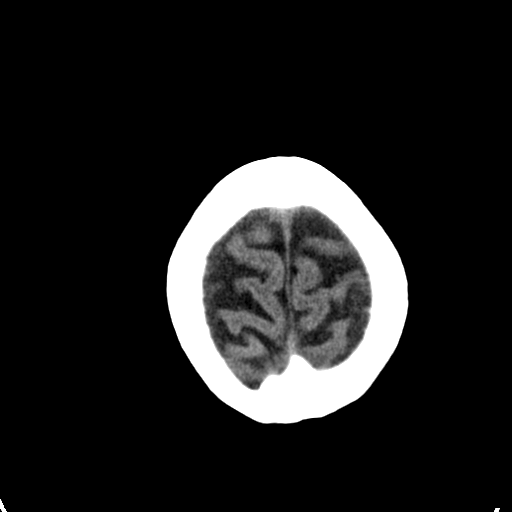
[im 28/32  brain]
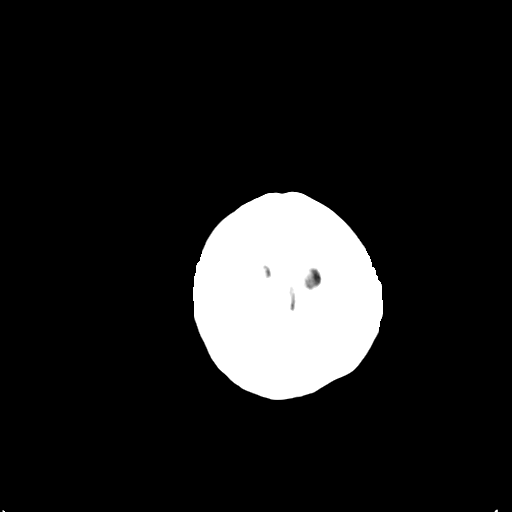
[im 30/32  brain]
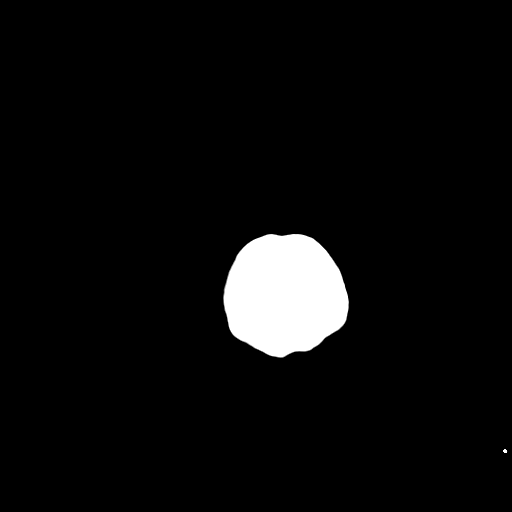

[16 of 30 positions shown; findings below may reference images not displayed]

FINDINGS: There is atrophy and chronic microvascular ischemic change. Remote
bilateral basal ganglia infarcts are seen. No evidence of acute
abnormality including hemorrhage, infarct, mass lesion, mass effect,
midline shift or abnormal extra-axial fluid collection. No
hydrocephalus or pneumocephalus. The calvarium is intact. Imaged
paranasal sinuses and mastoid air cells are clear.
IMPRESSION: No acute abnormality.

Atrophy, chronic microvascular ischemic change and remote bilateral
basal ganglia infarcts.

## 2019-02-02 ENCOUNTER — Encounter: Payer: Self-pay | Admitting: Obstetrics and Gynecology

## 2019-03-24 DEATH — deceased
